# Patient Record
Sex: Female | Born: 1935 | Race: Black or African American | Hispanic: No
Health system: Southern US, Community
[De-identification: ages and names within clinical notes are randomized; demographics above are authoritative.]

## PROBLEM LIST (undated history)

## (undated) DIAGNOSIS — I639 Cerebral infarction, unspecified: Secondary | ICD-10-CM

## (undated) DIAGNOSIS — M1990 Unspecified osteoarthritis, unspecified site: Secondary | ICD-10-CM

## (undated) DIAGNOSIS — I10 Essential (primary) hypertension: Secondary | ICD-10-CM

---

## 2018-01-04 ENCOUNTER — Other Ambulatory Visit: Payer: Self-pay

## 2018-01-04 ENCOUNTER — Emergency Department
Admission: EM | Admit: 2018-01-04 | Discharge: 2018-01-04 | Disposition: A | Discharge status: Home / Self Care | Payer: Medicare Other | Attending: Student in an Organized Health Care Education/Training Program | Admitting: Student in an Organized Health Care Education/Training Program

## 2018-01-04 ENCOUNTER — Emergency Department: Payer: Medicare Other

## 2018-01-04 DIAGNOSIS — I10 Essential (primary) hypertension: Secondary | ICD-10-CM | POA: Insufficient documentation

## 2018-01-04 DIAGNOSIS — M79605 Pain in left leg: Secondary | ICD-10-CM | POA: Insufficient documentation

## 2018-01-04 DIAGNOSIS — W19XXXA Unspecified fall, initial encounter: Secondary | ICD-10-CM

## 2018-01-04 DIAGNOSIS — M79604 Pain in right leg: Secondary | ICD-10-CM | POA: Insufficient documentation

## 2018-01-04 DIAGNOSIS — F0390 Unspecified dementia without behavioral disturbance: Secondary | ICD-10-CM | POA: Insufficient documentation

## 2018-01-04 DIAGNOSIS — R531 Weakness: Secondary | ICD-10-CM | POA: Insufficient documentation

## 2018-01-04 HISTORY — DX: Essential (primary) hypertension: I10

## 2018-01-04 HISTORY — DX: Unspecified osteoarthritis, unspecified site: M19.90

## 2018-01-04 LAB — CBC WITH DIFFERENTIAL/PLATELET
BASOS PCT: 0 %
Basophils Absolute: 0 10*3/uL (ref 0–0.1)
Eosinophils Absolute: 0 10*3/uL (ref 0–0.7)
Eosinophils Relative: 0 %
HEMATOCRIT: 32.6 % — AB (ref 35.0–47.0)
Hemoglobin: 10.6 g/dL — ABNORMAL LOW (ref 12.0–16.0)
LYMPHS ABS: 0.7 10*3/uL — AB (ref 1.0–3.6)
Lymphocytes Relative: 14 %
MCH: 28.3 pg (ref 26.0–34.0)
MCHC: 32.5 g/dL (ref 32.0–36.0)
MCV: 86.9 fL (ref 80.0–100.0)
MONOS PCT: 10 %
Monocytes Absolute: 0.5 10*3/uL (ref 0.2–0.9)
NEUTROS ABS: 3.8 10*3/uL (ref 1.4–6.5)
Neutrophils Relative %: 76 %
Platelets: 174 10*3/uL (ref 150–440)
RBC: 3.75 MIL/uL — AB (ref 3.80–5.20)
RDW: 16.1 % — ABNORMAL HIGH (ref 11.5–14.5)
WBC: 5 10*3/uL (ref 3.6–11.0)

## 2018-01-04 LAB — COMPREHENSIVE METABOLIC PANEL
ALBUMIN: 3.3 g/dL — AB (ref 3.5–5.0)
ALK PHOS: 63 U/L (ref 38–126)
ALT: 26 U/L (ref 14–54)
ANION GAP: 14 (ref 5–15)
AST: 54 U/L — ABNORMAL HIGH (ref 15–41)
BILIRUBIN TOTAL: 0.7 mg/dL (ref 0.3–1.2)
BUN: 23 mg/dL — ABNORMAL HIGH (ref 6–20)
CO2: 24 mmol/L (ref 22–32)
CREATININE: 1.19 mg/dL — AB (ref 0.44–1.00)
Calcium: 9.1 mg/dL (ref 8.9–10.3)
Chloride: 95 mmol/L — ABNORMAL LOW (ref 101–111)
GFR calc Af Amer: 46 mL/min — ABNORMAL LOW (ref >60)
GFR calc non Af Amer: 40 mL/min — ABNORMAL LOW (ref >60)
GLUCOSE: 138 mg/dL — AB (ref 65–99)
Potassium: 3.8 mmol/L (ref 3.5–5.1)
SODIUM: 133 mmol/L — AB (ref 135–145)
TOTAL PROTEIN: 8 g/dL (ref 6.5–8.1)

## 2018-01-04 NOTE — ED Notes (Signed)
Report received - pt had xr - no urine collected and pt unable to urinate when checked.

## 2018-01-04 NOTE — ED Notes (Signed)
Went in room to cath pt - family at bedside. They do not want her cath'd just want discharged if nothing broken. They asked how she would get home - advised we could call ems. Family asked how much it would cost - unsure but if not covered they would get a bill. Family states they can take her. Grandson and daughter will take pt home.

## 2018-01-04 NOTE — ED Provider Notes (Signed)
[facility] Emergency Department Provider Note    First MD Initiated Contact with Patient 01/04/18 1755     (approximate)  I have reviewed the triage vital signs and the nursing notes.   HISTORY  Chief Complaint Fall  Level V Caveat:  dementia  HPI Ashlee Schwartz is a 84 y.o. female from [facility] facility reported fall today generalized weakness and flulike symptoms.  Patient is complaining initially of left leg pain but then subsequently changed and said that her right leg is hurting.  Denies any headache.  No abdominal pain.  No nausea or vomiting or fevers.  No cough or shortness of breath.  Past Medical History:  Diagnosis Date  . Arthritis   . Hypertension    History reviewed. No pertinent family history.  There are no active problems to display for this patient.     Prior to Admission medications   Not on File    Allergies Patient has no known allergies.    Social History Social History   Tobacco Use  . Smoking status: Not on file  . Smokeless tobacco: Never Used  Substance Use Topics  . Alcohol use: No    Frequency: Never  . Drug use: No    Review of Systems Patient denies headaches, rhinorrhea, blurry vision, numbness, shortness of breath, chest pain, edema, cough, abdominal pain, nausea, vomiting, diarrhea, dysuria, fevers, rashes or hallucinations unless otherwise stated above in HPI. ____________________________________________   PHYSICAL EXAM:  VITAL SIGNS: Vitals:   01/04/18 1755 01/04/18 1800  BP: (!) 162/69 (!) 148/71  Pulse: 71   Resp: 18 18  Temp: 99.3 F (37.4 C)   SpO2: 96%     Constitutional: Alert in no acute distress. Eyes: Conjunctivae are normal.  Head: Atraumatic. Nose: No congestion/rhinnorhea. Mouth/Throat: Mucous membranes are moist.   Neck: No stridor. Painless ROM.  Cardiovascular: Normal rate, regular rhythm. Grossly normal heart sounds.  Good peripheral  circulation. Respiratory: Normal respiratory effort.  No retractions. Lungs CTAB. Gastrointestinal: Soft and nontender. No distention. No abdominal bruits. No CVA tenderness. Genitourinary:  Musculoskeletal: No lower extremity tenderness nor edema.  No joint effusions. Neurologic:  Normal speech and language. No gross focal neurologic deficits are appreciated. No facial droop Skin:  Skin is warm, dry and intact. No rash noted. Psychiatric: Mood and affect are normal.  ____________________________________________   LABS (all labs ordered are listed, but only abnormal results are displayed)  Results for orders placed or performed during the hospital encounter of 01/04/18 (from the past 24 hour(s))  CBC with Differential/Platelet     Status: Abnormal   Collection Time: 01/04/18  6:05 PM  Result Value Ref Range   WBC 5.0 3.6 - 11.0 K/uL   RBC 3.75 (L) 3.80 - 5.20 MIL/uL   Hemoglobin 10.6 (L) 12.0 - 16.0 g/dL   HCT 16.132.6 (L) 09.635.0 - 04.547.0 %   MCV 86.9 80.0 - 100.0 fL   MCH 28.3 26.0 - 34.0 pg   MCHC 32.5 32.0 - 36.0 g/dL   RDW 40.916.1 (H) 81.111.5 - 91.414.5 %   Platelets 174 150 - 440 K/uL   Neutrophils Relative % 76 %   Neutro Abs 3.8 1.4 - 6.5 K/uL   Lymphocytes Relative 14 %   Lymphs Abs 0.7 (L) 1.0 - 3.6 K/uL   Monocytes Relative 10 %   Monocytes Absolute 0.5 0.2 - 0.9 K/uL   Eosinophils Relative 0 %   Eosinophils Absolute 0.0 0 - 0.7 K/uL  Basophils Relative 0 %   Basophils Absolute 0.0 0 - 0.1 K/uL  Comprehensive metabolic panel     Status: Abnormal   Collection Time: 01/04/18  6:05 PM  Result Value Ref Range   Sodium 133 (L) 135 - 145 mmol/L   Potassium 3.8 3.5 - 5.1 mmol/L   Chloride 95 (L) 101 - 111 mmol/L   CO2 24 22 - 32 mmol/L   Glucose, Bld 138 (H) 65 - 99 mg/dL   BUN 23 (H) 6 - 20 mg/dL   Creatinine, Ser 1.61 (H) 0.44 - 1.00 mg/dL   Calcium 9.1 8.9 - 09.6 mg/dL   Total Protein 8.0 6.5 - 8.1 g/dL   Albumin 3.3 (L) 3.5 - 5.0 g/dL   AST 54 (H) 15 - 41 U/L   ALT 26 14 - 54  U/L   Alkaline Phosphatase 63 38 - 126 U/L   Total Bilirubin 0.7 0.3 - 1.2 mg/dL   GFR calc non Af Amer 40 (L) >60 mL/min   GFR calc Af Amer 46 (L) >60 mL/min   Anion gap 14 5 - 15   ____________________________________________  ____________________________________________  RADIOLOGY  I personally reviewed all radiographic images ordered to evaluate for the above acute complaints and reviewed radiology reports and findings.  These findings were personally discussed with the patient.  Please see medical record for radiology report.  ____________________________________________   PROCEDURES  Procedure(s) performed:  Procedures    Critical Care performed: no ____________________________________________   INITIAL IMPRESSION / ASSESSMENT AND PLAN / ED COURSE  Pertinent labs & imaging results that were available during my care of the patient were reviewed by me and considered in my medical decision making (see chart for details).  DDX: Fracture, dehydration, UTI, pneumonia, syncope  Ashlee Schwartz is a 84 y.o. who presents to the ED with fall at facility.  Family at bedside states that this is a frequent occurrence for her and says that she is otherwise at baseline.  Radiographs are noticed fracture but did show evidence of chronic arthritis for which she has a history of rheumatoid arthritis.  Renal function shows some mild chronic kidney disease no evidence of infection.  Discussed options for further diagnostic testing with daughter and grandson at bedside and they have elected to take patient back to facility.  Have discussed with the patient and available family all diagnostics and treatments performed thus far and all questions were answered to the best of my ability. The patient demonstrates understanding and agreement with plan.       As part of my medical decision making, I reviewed the following data within the electronic MEDICAL RECORD NUMBER Nursing notes reviewed and  incorporated, Labs reviewed, notes from prior ED visits.   ____________________________________________   FINAL CLINICAL IMPRESSION(S) / ED DIAGNOSES  Final diagnoses:  Fall, initial encounter      NEW MEDICATIONS STARTED DURING THIS VISIT:  New Prescriptions   No medications on file     Note:  This document was prepared using Dragon voice recognition software and may include unintentional dictation errors.    Willy Eddy, MD 01/04/18 2033

## 2018-01-04 NOTE — Discharge Instructions (Signed)
Is return for any additional questions or concerns.

## 2018-01-04 NOTE — ED Notes (Signed)
To x-ray and returned, grandson sitting at bedside

## 2018-01-07 ENCOUNTER — Emergency Department: Payer: Medicare Other

## 2018-01-07 ENCOUNTER — Emergency Department
Admission: EM | Admit: 2018-01-07 | Discharge: 2018-01-07 | Disposition: A | Discharge status: Home / Self Care | Payer: Medicare Other | Attending: Emergency Medicine | Admitting: Emergency Medicine

## 2018-01-07 ENCOUNTER — Other Ambulatory Visit: Payer: Self-pay

## 2018-01-07 DIAGNOSIS — Z8673 Personal history of transient ischemic attack (TIA), and cerebral infarction without residual deficits: Secondary | ICD-10-CM | POA: Diagnosis not present

## 2018-01-07 DIAGNOSIS — G4751 Confusional arousals: Secondary | ICD-10-CM | POA: Diagnosis not present

## 2018-01-07 DIAGNOSIS — R410 Disorientation, unspecified: Secondary | ICD-10-CM

## 2018-01-07 DIAGNOSIS — I10 Essential (primary) hypertension: Secondary | ICD-10-CM | POA: Diagnosis not present

## 2018-01-07 DIAGNOSIS — Z79899 Other long term (current) drug therapy: Secondary | ICD-10-CM | POA: Insufficient documentation

## 2018-01-07 DIAGNOSIS — R4182 Altered mental status, unspecified: Secondary | ICD-10-CM | POA: Diagnosis present

## 2018-01-07 DIAGNOSIS — Z7982 Long term (current) use of aspirin: Secondary | ICD-10-CM | POA: Insufficient documentation

## 2018-01-07 HISTORY — DX: Cerebral infarction, unspecified: I63.9

## 2018-01-07 LAB — URINALYSIS, COMPLETE (UACMP) WITH MICROSCOPIC
Bilirubin Urine: NEGATIVE
GLUCOSE, UA: NEGATIVE mg/dL
Hgb urine dipstick: NEGATIVE
Ketones, ur: NEGATIVE mg/dL
LEUKOCYTES UA: NEGATIVE
NITRITE: NEGATIVE
PH: 5 (ref 5.0–8.0)
Protein, ur: NEGATIVE mg/dL
SPECIFIC GRAVITY, URINE: 1.01 (ref 1.005–1.030)

## 2018-01-07 LAB — CBC WITH DIFFERENTIAL/PLATELET
BASOS ABS: 0 10*3/uL (ref 0–0.1)
BASOS PCT: 1 %
EOS ABS: 0 10*3/uL (ref 0–0.7)
EOS PCT: 1 %
HCT: 31.5 % — ABNORMAL LOW (ref 35.0–47.0)
Hemoglobin: 10.5 g/dL — ABNORMAL LOW (ref 12.0–16.0)
Lymphocytes Relative: 19 %
Lymphs Abs: 0.9 10*3/uL — ABNORMAL LOW (ref 1.0–3.6)
MCH: 28.4 pg (ref 26.0–34.0)
MCHC: 33.2 g/dL (ref 32.0–36.0)
MCV: 85.7 fL (ref 80.0–100.0)
MONO ABS: 0.4 10*3/uL (ref 0.2–0.9)
Monocytes Relative: 9 %
Neutro Abs: 3.4 10*3/uL (ref 1.4–6.5)
Neutrophils Relative %: 70 %
PLATELETS: 202 10*3/uL (ref 150–440)
RBC: 3.68 MIL/uL — ABNORMAL LOW (ref 3.80–5.20)
RDW: 15.5 % — AB (ref 11.5–14.5)
WBC: 4.9 10*3/uL (ref 3.6–11.0)

## 2018-01-07 LAB — COMPREHENSIVE METABOLIC PANEL
ALBUMIN: 2.7 g/dL — AB (ref 3.5–5.0)
ALK PHOS: 57 U/L (ref 38–126)
ALT: 24 U/L (ref 14–54)
AST: 40 U/L (ref 15–41)
Anion gap: 11 (ref 5–15)
BILIRUBIN TOTAL: 0.6 mg/dL (ref 0.3–1.2)
BUN: 27 mg/dL — ABNORMAL HIGH (ref 6–20)
CALCIUM: 8.6 mg/dL — AB (ref 8.9–10.3)
CO2: 27 mmol/L (ref 22–32)
CREATININE: 1.21 mg/dL — AB (ref 0.44–1.00)
Chloride: 95 mmol/L — ABNORMAL LOW (ref 101–111)
GFR calc Af Amer: 45 mL/min — ABNORMAL LOW (ref >60)
GFR calc non Af Amer: 39 mL/min — ABNORMAL LOW (ref >60)
Glucose, Bld: 122 mg/dL — ABNORMAL HIGH (ref 65–99)
Potassium: 3.3 mmol/L — ABNORMAL LOW (ref 3.5–5.1)
Sodium: 133 mmol/L — ABNORMAL LOW (ref 135–145)
TOTAL PROTEIN: 7.4 g/dL (ref 6.5–8.1)

## 2018-01-07 NOTE — ED Provider Notes (Signed)
This patient was signed out to me by Dr. Michell HeinrichVernice.  84 year old female who was brought in for altered mental status, but upon arrival here was hemodynamically stable without any evidence of confusion.  In addition her physical examination was reassuring.  Her laboratory studies are reassuring.  She has a chronic hyponatremia with a sodium of 133 which is unchanged.  Her renal insufficiency is also at baseline.  She does not have any change in her chronic anemia.  Skin does not show any acute intracranial process and her chest x-ray is also reassuring without any evidence of infection.  Her urinalysis has bacteriuria without any other evidence of infection.  I have sent a culture but acute initiation of antibiotics is not indicated.  At this time, the patient is safe for discharge home.  I have discussed return precautions as well as follow-up instructions with her.   Rockne MenghiniNorman, Anne-Caroline, MD 01/07/18 2132

## 2018-01-07 NOTE — ED Notes (Signed)
Family member at bedside.

## 2018-01-07 NOTE — ED Notes (Signed)
Attempted IV x 2, unsuccessful. Asked Trish Magenna Rn to try.

## 2018-01-07 NOTE — ED Notes (Signed)
Pt taken to scans via stretcher. Will attempt IV access and blood draw when pt returns.

## 2018-01-07 NOTE — ED Provider Notes (Signed)
[facility] Emergency Department Provider Note  ____________________________________________  Time seen: Approximately 6:17 PM  I have reviewed the triage vital signs and the nursing notes.   HISTORY  Chief Complaint Altered Mental Status  Level 5 caveat:  Portions of the history and physical were unable to be obtained due to dementia   HPI Kaylinn Dedic is a 84 y.o. female with h/o CVA, HTN, RA, dementia who presents from [place] [facility] for altered mental status. According to the facility patient is usually alert, oriented, ambulatory. Today patient had difficulty walking, was very confused, was putting her underwear top of her clothes. When understand and staff told her to take her medication. No fever, no vomiting or diarrhea. Patient was seen here 3 days ago after a fall with negative bilateral hip x-rays. Patient has no complaints and denies abdominal pain, headache, chest pain, shortness of breath. She does report a mild cough. She denies vomiting or diarrhea. She denies dysuria.  Past Medical History:  Diagnosis Date  . Arthritis   . Hypertension   . Stroke [facility])     Prior to Admission medications   Medication Sig Start Date End Date Taking? Authorizing Provider  acetaminophen (TYLENOL) 325 MG tablet Take 650 mg by mouth every 6 (six) hours as needed.   Yes [provider]  aspirin EC 81 MG tablet Take 81 mg by mouth daily.   Yes [provider]  atorvastatin (LIPITOR) 40 MG tablet Take 40 mg by mouth at bedtime.   Yes [provider]  Calcium Carb-Cholecalciferol (CALCIUM 600 + D) 600-200 MG-UNIT TABS Take 1 tablet by mouth daily.   Yes [provider]  docusate sodium (COLACE) 100 MG capsule Take 100 mg by mouth 2 (two) times daily.   Yes [provider]  ferrous sulfate 325 (65 FE) MG EC tablet Take 325 mg by mouth daily.   Yes [provider]  hydrochlorothiazide (HYDRODIURIL) 25 MG  tablet Take 25 mg by mouth daily.   Yes [provider]  leflunomide (ARAVA) 20 MG tablet Take 20 mg by mouth daily.   Yes [provider]  Multiple Vitamins-Minerals (MULTIVITAMIN) tablet Take 1 tablet by mouth daily.   Yes [provider]  sertraline (ZOLOFT) 25 MG tablet Take 25 mg by mouth daily.   Yes [provider]    Allergies Patient has no known allergies.  History reviewed. No pertinent family history.  Social History Social History   Tobacco Use  . Smoking status: Not on file  . Smokeless tobacco: Never Used  Substance Use Topics  . Alcohol use: No    Frequency: Never  . Drug use: No    Review of Systems  Constitutional: Negative for fever. + confusion ENT: Negative for sore throat. Neck: No neck pain  Cardiovascular: Negative for chest pain. Respiratory: Negative for shortness of breath. Gastrointestinal: Negative for abdominal pain, vomiting or diarrhea. Genitourinary: Negative for dysuria. Musculoskeletal: Negative for back pain. Skin: Negative for rash. Neurological: Negative for headaches, weakness or numbness. Psych: No SI or HI  ____________________________________________   PHYSICAL EXAM:  VITAL SIGNS: ED Triage Vitals [01/07/18 1757]  Enc Vitals Group     BP 99/66     Pulse Rate (!) 47     Resp 18     Temp 98.7 F (37.1 C)     Temp Source Oral     SpO2 96 %     Weight      Height 5'  8" (1.727 m)     Head Circumference      Peak Flow      Pain Score 0     Pain Loc      Pain Edu?      Excl. in GC?     Constitutional: Alert and oriented x 2, answers questions appropriately, no distress.  HEENT:      Head: Normocephalic and atraumatic.         Eyes: Conjunctivae are normal. Sclera is non-icteric.       Mouth/Throat: Mucous membranes are dry.       Neck: Supple with no signs of meningismus. Cardiovascular: irregularly irregular rhythm but normal rate. No murmurs, gallops, or rubs. 2+ symmetrical  distal pulses are present in all extremities. No JVD. Respiratory: Normal respiratory effort. Lungs are clear to auscultation bilaterally. No wheezes, crackles, or rhonchi.  Gastrointestinal: Soft, non tender, and non distended with positive bowel sounds. No rebound or guarding. Musculoskeletal: Nontender with normal range of motion in all extremities. No edema, cyanosis, or erythema of extremities. Neurologic: Normal speech and language. Face is symmetric. Moving all extremities. No gross focal neurologic deficits are appreciated. Skin: Skin is warm, dry and intact. No rash noted. Psychiatric: Mood and affect are normal. Speech and behavior are normal.  ____________________________________________   LABS (all labs ordered are listed, but only abnormal results are displayed)  Labs Reviewed  CBC WITH DIFFERENTIAL/PLATELET - Abnormal; Notable for the following components:      Result Value   RBC 3.68 (*)    Hemoglobin 10.5 (*)    HCT 31.5 (*)    RDW 15.5 (*)    Lymphs Abs 0.9 (*)    All other components within normal limits  COMPREHENSIVE METABOLIC PANEL  URINALYSIS, COMPLETE (UACMP) WITH MICROSCOPIC  CBG MONITORING, ED   ____________________________________________  EKG  ED ECG REPORT I, Nita Sicklearolina Shanyce Daris, the attending physician, personally viewed and interpreted this ECG.  Normal sinus rhythm with frequent PACs, rate of 91, normal intervals, normal axis, no ST elevations or depressions. no prior for comparison ____________________________________________  RADIOLOGY  I have personally reviewed the images performed during this visit and I agree with the Radiologist's read.   Interpretation by Radiologist:  Dg Chest 2 View  Result Date: 01/07/2018 CLINICAL DATA:  Increased altered mental status. EXAM: CHEST - 2 VIEW COMPARISON:  None. FINDINGS: The heart size and mediastinal contours are within normal limits. Mild aortic atherosclerosis at the arch without aneurysm. Lungs  are hyperinflated with flattening of the diaphragms. No acute pneumonic consolidation or CHF. No effusion or pneumothorax. Minimal bibasilar atelectasis is noted left-greater-than-right. Mild degenerative change along the dorsal spine and both shoulders. IMPRESSION: Hyperinflated lungs. No active pulmonary disease. Aortic atherosclerosis. Electronically Signed   By: Tollie Ethavid  Kwon M.D.   On: 01/07/2018 18:45   Ct Head Wo Contrast  Result Date: 01/07/2018 CLINICAL DATA:  Increased confusion history of recent fall EXAM: CT HEAD WITHOUT CONTRAST TECHNIQUE: Contiguous axial images were obtained from the base of the skull through the vertex without intravenous contrast. COMPARISON:  None. FINDINGS: Brain: No acute territorial infarction, hemorrhage or intracranial mass is visualized. Moderate small vessel ischemic changes of the white matter. Probable old lacunar infarcts in the bilateral basal ganglia. Moderate-to-marked atrophy. Prominent ventricles likely related to atrophy Vascular: No hyperdense vessels.  Carotid vascular calcification Skull: No fracture Sinuses/Orbits: Mucosal thickening in the ethmoid sinuses. No acute orbital abnormality. Other: Small right forehead soft tissue thickening IMPRESSION: 1. No CT evidence for  acute intracranial abnormality. 2. Atrophy and small vessel ischemic changes of the white matter Electronically Signed   By: Jasmine Pang M.D.   On: 01/07/2018 18:36      ____________________________________________   PROCEDURES  Procedure(s) performed: None Procedures Critical Care performed:  None ____________________________________________   INITIAL IMPRESSION / ASSESSMENT AND PLAN / ED COURSE  84 y.o. female with h/o CVA, HTN, RA, dementia who presents from [place] [facility] care for altered mental status. Patient is well-appearing, in no distress, has normal vital signs, she is grossly neurologically intact, she is alert and oriented 2, she has no complaints. Her EKG  shows sinus rhythm with frequent PACs, abdomen is soft. Patient had a recent fall 3 days ago. We'll send patient for CT head to rule out intracranial injury. She is on blood thinners. We'll check UA to rule out UTI. We'll check labs to rule out dehydration, anemia, electrolyte abnormalities.  Clinical Course as of Jan 08 2004  Wed Jan 07, 2018  2003 CT head and CXR negative for acute findings. Labs and UA pending. Patient is remains alert and oriented x 2. Plan to dc back to SNF if patient remains alert and labs/ UA are within normal limits. Care transferred to Dr. Sharma Covert.  [CV]    Clinical Course User Index [CV] Don Perking [place], MD     As part of my medical decision making, I reviewed the following data within the electronic MEDICAL RECORD NUMBER Nursing notes reviewed and incorporated, Labs reviewed , EKG interpreted , Radiograph reviewed , Notes from prior ED visits and [place] Controlled Substance Database    Pertinent labs & imaging results that were available during my care of the patient were reviewed by me and considered in my medical decision making (see chart for details).    ____________________________________________   FINAL CLINICAL IMPRESSION(S) / ED DIAGNOSES  Final diagnoses:  Confusion      NEW MEDICATIONS STARTED DURING THIS VISIT:  ED Discharge Orders    None       Note:  This document was prepared using Dragon voice recognition software and may include unintentional dictation errors.    Don Perking, [place], MD 01/07/18 2005

## 2018-01-07 NOTE — ED Triage Notes (Signed)
Pt [facility] (hx dementia) via [facility]. Per EMS pt has been more confused and is not at baseline. [facility] states pt is alert, oriented, ambulatory usually but today couldn't walk, more confused, put her underwear on top of clothes, and was unable to swallow medications d/t not comprehending what to do. VSS with EMS, CBG 135. Pt had a fall on 3/17 with negative hip xrays. Pt denies pain. Answering questions appropriately for this RN.

## 2018-01-09 LAB — URINE CULTURE: Culture: NO GROWTH

## 2018-11-09 ENCOUNTER — Other Ambulatory Visit: Payer: Self-pay

## 2018-11-09 ENCOUNTER — Emergency Department
Admission: EM | Admit: 2018-11-09 | Discharge: 2018-11-10 | Disposition: A | Discharge status: Home / Self Care | Attending: Emergency Medicine | Admitting: Emergency Medicine

## 2018-11-09 DIAGNOSIS — I10 Essential (primary) hypertension: Secondary | ICD-10-CM | POA: Diagnosis not present

## 2018-11-09 DIAGNOSIS — Z79899 Other long term (current) drug therapy: Secondary | ICD-10-CM | POA: Insufficient documentation

## 2018-11-09 DIAGNOSIS — R627 Adult failure to thrive: Secondary | ICD-10-CM | POA: Diagnosis not present

## 2018-11-09 DIAGNOSIS — Z8673 Personal history of transient ischemic attack (TIA), and cerebral infarction without residual deficits: Secondary | ICD-10-CM | POA: Diagnosis not present

## 2018-11-09 NOTE — ED Triage Notes (Signed)
Pt brought in from [place] by EMS for lack of appetite and failure to thrive. Pt is a hospice pt for the same. Kendal Hymen (hospice RN) called prior to patient arrival.

## 2018-11-09 NOTE — ED Notes (Addendum)
Assumed most answers on triage as pt would only answer that she was in pain a "little bit." Pt moaned in discomfort. Keeps eyes closed unless specifically asked to open them. Breathing shallow. EDP assessed sacral area.

## 2018-11-10 LAB — COMPREHENSIVE METABOLIC PANEL
ALBUMIN: 2.8 g/dL — AB (ref 3.5–5.0)
ALK PHOS: 55 U/L (ref 38–126)
ALT: 8 U/L (ref 0–44)
ANION GAP: 5 (ref 5–15)
AST: 19 U/L (ref 15–41)
BILIRUBIN TOTAL: 0.5 mg/dL (ref 0.3–1.2)
BUN: 15 mg/dL (ref 8–23)
CALCIUM: 7.5 mg/dL — AB (ref 8.9–10.3)
CO2: 23 mmol/L (ref 22–32)
Chloride: 112 mmol/L — ABNORMAL HIGH (ref 98–111)
Creatinine, Ser: 0.89 mg/dL (ref 0.44–1.00)
GFR calc Af Amer: >60 mL/min (ref >60)
GFR, EST NON AFRICAN AMERICAN: 58 mL/min — AB (ref >60)
GLUCOSE: 72 mg/dL (ref 70–99)
POTASSIUM: 3.6 mmol/L (ref 3.5–5.1)
Sodium: 140 mmol/L (ref 135–145)
Total Protein: 5.9 g/dL — ABNORMAL LOW (ref 6.5–8.1)

## 2018-11-10 LAB — CBC WITH DIFFERENTIAL/PLATELET
Abs Immature Granulocytes: 0.01 10*3/uL (ref 0.00–0.07)
Basophils Absolute: 0 10*3/uL (ref 0.0–0.1)
Basophils Relative: 1 %
EOS ABS: 0.2 10*3/uL (ref 0.0–0.5)
EOS PCT: 7 %
HEMATOCRIT: 37.1 % (ref 36.0–46.0)
Hemoglobin: 11.6 g/dL — ABNORMAL LOW (ref 12.0–15.0)
Immature Granulocytes: 0 %
LYMPHS PCT: 50 %
Lymphs Abs: 1.6 10*3/uL (ref 0.7–4.0)
MCH: 29.6 pg (ref 26.0–34.0)
MCHC: 31.3 g/dL (ref 30.0–36.0)
MCV: 94.6 fL (ref 80.0–100.0)
MONO ABS: 0.4 10*3/uL (ref 0.1–1.0)
MONOS PCT: 13 %
NRBC: 0 % (ref 0.0–0.2)
Neutro Abs: 1 10*3/uL — ABNORMAL LOW (ref 1.7–7.7)
Neutrophils Relative %: 29 %
Platelets: 137 10*3/uL — ABNORMAL LOW (ref 150–400)
RBC: 3.92 MIL/uL (ref 3.87–5.11)
RDW: 14.3 % (ref 11.5–15.5)
WBC: 3.3 10*3/uL — ABNORMAL LOW (ref 4.0–10.5)

## 2018-11-10 LAB — GLUCOSE, CAPILLARY: GLUCOSE-CAPILLARY: 95 mg/dL (ref 70–99)

## 2018-11-10 MED ORDER — DEXTROSE 50 % IV SOLN
25.0000 mL | Freq: Once | INTRAVENOUS | Status: AC
  Administered 2018-11-10: 25 mL via INTRAVENOUS

## 2018-11-10 MED ORDER — SODIUM CHLORIDE 0.9 % IV BOLUS
1000.0000 mL | Freq: Once | INTRAVENOUS | Status: AC
  Administered 2018-11-10: 1000 mL via INTRAVENOUS

## 2018-11-10 MED ORDER — DEXTROSE 50 % IV SOLN
INTRAVENOUS | Status: AC
  Administered 2018-11-10: 25 mL via INTRAVENOUS
  Filled 2018-11-10: qty 50

## 2018-11-10 NOTE — ED Notes (Signed)
Family at bedside. 

## 2018-11-10 NOTE — ED Notes (Signed)
Pt given warm blankets. Family at bedside updated.

## 2018-11-10 NOTE — ED Provider Notes (Signed)
[facility] Emergency Department Provider Note  ____________________________________________   First MD Initiated Contact with Patient 11/09/18 2336     (approximate)  I have reviewed the triage vital signs and the nursing notes.   HISTORY  Chief Complaint Failure To Thrive   HPI Ashlee Schwartz is a 85 y.o. female with a history of dementia, arthritis, hypertension and stroke was presented emergency department with failure to thrive.  She is brought in by EMS reports normal vital signs.  Patient also is on hospice.  Patient is nonverbal at this time.  Further history obtained from sister-in-law who reports that she would like blood work done as well as nonpainful interventions such as possible IV fluids for dehydration.  Reports the patient has stopped eating and drinking over the past 3 to 4 days as well as become more somnolent.  POA/sister-in-law at the bedside says that she would not like a cathed urine specimen done.  Spoke with another relative on the phone who says that family may not be able to make a decision in this at this time as to whether the patient would be appropriate for hospice house or not he may want the patient admitted to the hospital.   Past Medical History:  Diagnosis Date  . Arthritis   . Hypertension   . Stroke [facility](HCC)     There are no active problems to display for this patient.   History reviewed. No pertinent surgical history.  Prior to Admission medications   Medication Sig Start Date End Date Taking? Authorizing Provider  acetaminophen (TYLENOL) 325 MG tablet Take 650 mg by mouth every 6 (six) hours as needed.   Yes [provider]  docusate sodium (COLACE) 100 MG capsule Take 100 mg by mouth 2 (two) times daily.   Yes [provider]  HYDROcodone-acetaminophen (NORCO/VICODIN) 5-325 MG tablet Take 1 tablet by mouth 3 (three) times daily as needed for moderate pain.   Yes [provider]  loperamide  (IMODIUM) 2 MG capsule Take 2 mg by mouth as needed for diarrhea or loose stools.   Yes [provider]  LORazepam (ATIVAN) 0.5 MG tablet Take 0.5 mg by mouth every 8 (eight) hours as needed for anxiety.   Yes [provider]  Morphine Sulfate (MORPHINE CONCENTRATE) 10 mg / 0.5 ml concentrated solution Take 5 mg by mouth every 2 (two) hours as needed for severe pain.   Yes [provider]  promethazine (PHENERGAN) 25 MG tablet Take 25 mg by mouth every 6 (six) hours as needed for nausea or vomiting.   Yes [provider]  sertraline (ZOLOFT) 25 MG tablet Take 25 mg by mouth daily.   Yes [provider]  traZODone (DESYREL) 50 MG tablet Take 50 mg by mouth at bedtime.   Yes [provider]  aspirin EC 81 MG tablet Take 81 mg by mouth daily.    [provider]  atorvastatin (LIPITOR) 40 MG tablet Take 40 mg by mouth at bedtime.    [provider]  Calcium Carb-Cholecalciferol (CALCIUM 600 + D) 600-200 MG-UNIT TABS Take 1 tablet by mouth daily.    [provider]  ferrous sulfate 325 (65 FE) MG EC tablet Take 325 mg by mouth daily.    [provider]  hydrochlorothiazide (HYDRODIURIL) 25 MG tablet Take 25 mg by mouth daily.    [provider]  leflunomide (ARAVA) 20 MG tablet Take 20 mg by mouth daily.    [provider]  Multiple Vitamins-Minerals (MULTIVITAMIN) tablet Take 1 tablet by mouth daily.    [provider]    Allergies Patient has no known allergies.  History reviewed. No pertinent family history.  Social History Social History   Tobacco Use  . Smoking status: Unknown If Ever Smoked  . Smokeless tobacco: Never Used  Substance Use Topics  . Alcohol use: No    Frequency: Never  . Drug use: No    Review of Systems  Level 5 caveat secondary to patient unresponsive at this time.   ____________________________________________   PHYSICAL EXAM:  VITAL  SIGNS: ED Triage Vitals  Enc Vitals Group     BP 11/09/18 2348 138/70     Pulse Rate 11/09/18 2350 73     Resp --      Temp 11/09/18 2343 97.8 F (36.6 C)     Temp Source 11/09/18 2343 Oral     SpO2 11/09/18 2350 98 %     Weight 11/09/18 2352 104 lb 6.4 oz (47.4 kg)     Height 11/09/18 2352 5\' 5"  (1.651 m)     Head Circumference --      Peak Flow --      Pain Score --      Pain Loc --      Pain Edu? --      Excl. in GC? --     Constitutional: Patient resting without any distress.  Occasionally opens eyes but is nonverbal. Eyes: Conjunctivae are normal.  Head: Atraumatic. Nose: No congestion/rhinnorhea. Mouth/Throat: Mucous membranes are moist.  Neck: No stridor.   Cardiovascular: Normal rate, regular rhythm. Grossly normal heart sounds.   Respiratory: Normal respiratory effort.  No retractions. Lungs CTAB. Gastrointestinal: Soft and nontender. No distention.  Musculoskeletal: No lower extremity tenderness nor edema.  No joint effusions. Neurologic:  Normal speech and language. No gross focal neurologic deficits are appreciated. Skin:  Skin is warm, dry and intact. No rash noted. Psychiatric: Mood and affect are normal. Speech and behavior are normal.  ____________________________________________   LABS (all labs ordered are listed, but only abnormal results are displayed)  Labs Reviewed  CBC WITH DIFFERENTIAL/PLATELET - Abnormal; Notable for the following components:      Result Value   WBC 3.3 (*)    Hemoglobin 11.6 (*)    Platelets 137 (*)    Neutro Abs 1.0 (*)    All other components within normal limits  COMPREHENSIVE METABOLIC PANEL - Abnormal; Notable for the following components:   Chloride 112 (*)    Calcium 7.5 (*)    Total Protein 5.9 (*)    Albumin 2.8 (*)    GFR calc non Af Amer 58 (*)    All other components within normal limits    ____________________________________________  EKG   ____________________________________________  RADIOLOGY   ____________________________________________   PROCEDURES  Procedure(s) performed:   Procedures  Critical Care performed:   ____________________________________________   INITIAL IMPRESSION / ASSESSMENT AND PLAN / ED COURSE  Pertinent labs & imaging results that were available during my care of the patient were reviewed by me and considered in my medical decision making (see chart for details).  Differential diagnosis includes, but is not limited to, alcohol, illicit or prescription medications, or other toxic ingestion; intracranial pathology such as stroke or intracerebral hemorrhage; fever or infectious causes including sepsis; hypoxemia and/or hypercarbia; uremia; trauma; endocrine related disorders such as diabetes, hypoglycemia, and thyroid-related diseases; hypertensive encephalopathy; etc. As part of my medical decision making, I  reviewed the following data within the electronic MEDICAL RECORD NUMBER Notes from prior ED visits  ----------------------------------------- 4:39 AM on 11/10/2018 -----------------------------------------  Patient at this time continues to be somnolent.  POA still at bedside and would like to speak with both social work and hospice to explore further options.  I will consult the services.  Patient remained in the emergency department.  No change in condition after fluids.  However, reassuring lab work. ____________________________________________   FINAL CLINICAL IMPRESSION(S) / ED DIAGNOSES  Failure to thrive.  NEW MEDICATIONS STARTED DURING THIS VISIT:  New Prescriptions   No medications on file     Note:  This document was prepared using Dragon voice recognition software and may include unintentional dictation errors.     Myrna Blazer, MD 11/10/18 [phone]

## 2018-11-10 NOTE — ED Notes (Signed)
Pt clean and dry, turned to right side. Vitals updated. Linens clean and dry.

## 2018-11-10 NOTE — ED Notes (Signed)
Hospice nurse at bedside.

## 2018-11-10 NOTE — ED Notes (Signed)
Report to Kate, RN

## 2018-11-10 NOTE — ED Notes (Signed)
Attempted IV x1.

## 2018-11-10 NOTE — ED Notes (Signed)
Pt unable to sign for DC d/t dementia/hospice. Pt family is not at bedside to sign for DC. [facility].

## 2018-11-10 NOTE — ED Notes (Signed)
CBG, 64. EDP informed. Attempted to given patient apple sauce but pt refuses to eat. EDP informed

## 2018-11-10 NOTE — ED Notes (Signed)
Hospice nurse at bedside, fed patient a cup of sherbert.

## 2018-11-10 NOTE — ED Notes (Addendum)
Family member requesting to go home and just be called by social work and hospice house when they arrive. Pt resting quietly with eyes closed and even respirations. No distress noted. Confirmed daughters phone number in computer at contact person for pt.

## 2018-11-10 NOTE — Discharge Instructions (Addendum)
Please return to the emergency department for any symptoms concerning to you.

## 2018-11-10 NOTE — Progress Notes (Signed)
ED visit made. Patient is currently followed by [facility] at [facility] with a hospice diagnosis of abnormal weight loss. She is a DNR code, with out of facility DNR in place at the facility. Patient was sent to the [facility] ED last night for evaluation of poor oral intake for the past few days. She has received IV fluids, labs appear to be WNL.  Patient seen lying on the ED stretcher, opened her eyes to voice. During visit patient ate a magic cup, albeit very slowly, no coughing or pocketing noted. No family at bedside. Writer spoke over the telephone to patient's HCPOA, her daughter in law Clarene Essex, discussed changes seen by her hospice team over the last few days. Clarene Essex has also been updated by her hospice case Production designer, theatre/television/film. Education and support provided. Plan is for patient to return back to [facility] care with the continued support of hospice services. Hospital care team and hospice team updated. Patient appeared to be resting in no distress at end of visit. Dayna Barker RN, BSN, [facility] [place], hospital Liaison [phone]

## 2018-12-20 DEATH — deceased

## 2019-04-07 IMAGING — CR DG FEMUR 2+V*L*
4 series · 4 of 4 positions shown · non-contrast
Comparison: None.

CLINICAL DATA: Pain following fall

EXAM:
LEFT FEMUR 2 VIEWS

[femur ap (1 of 2)]
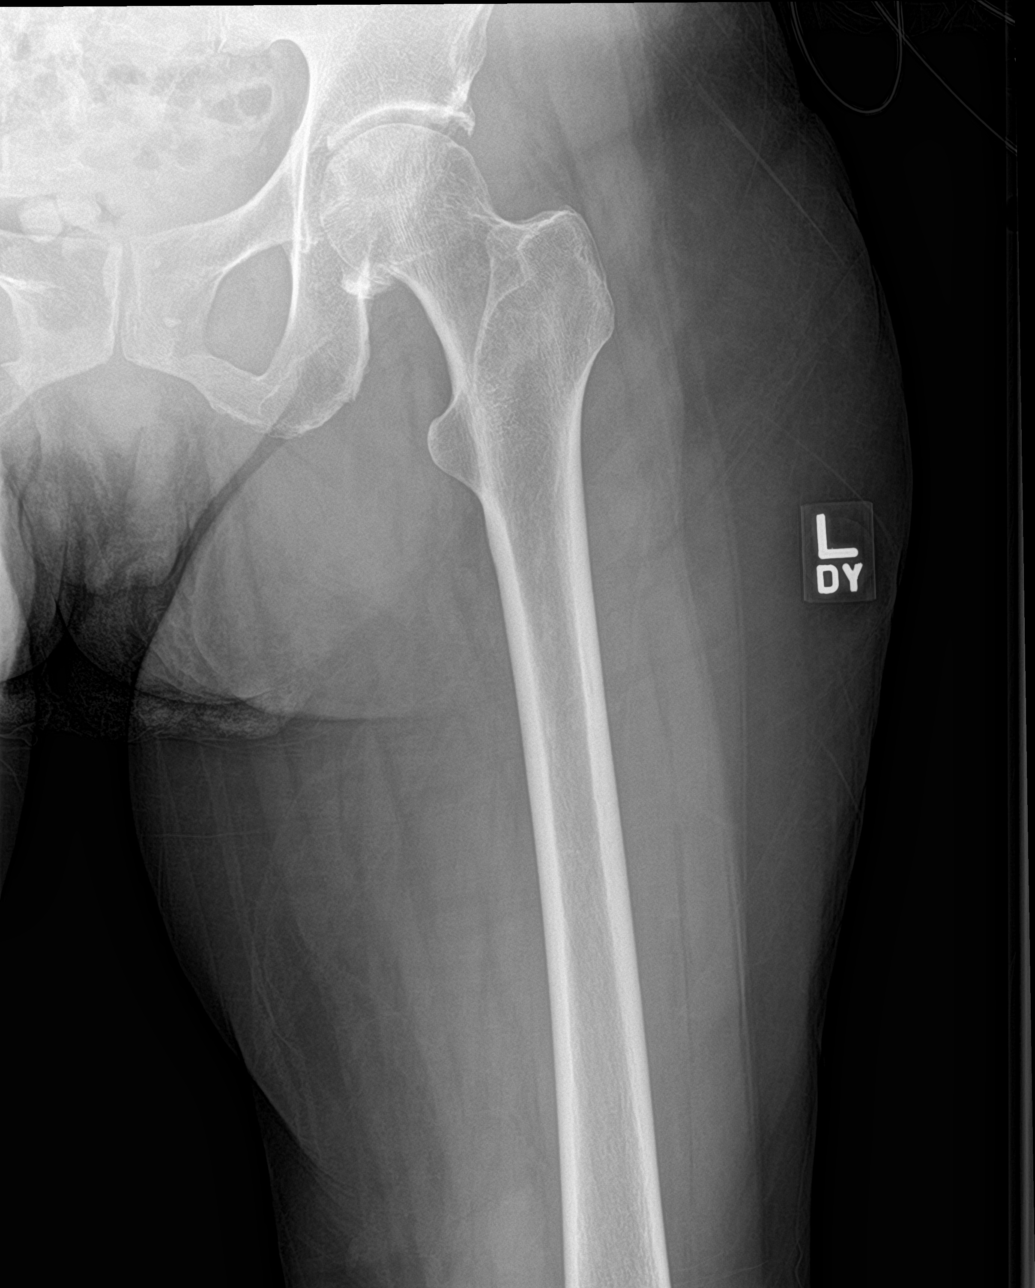

[femur ap (2 of 2)]
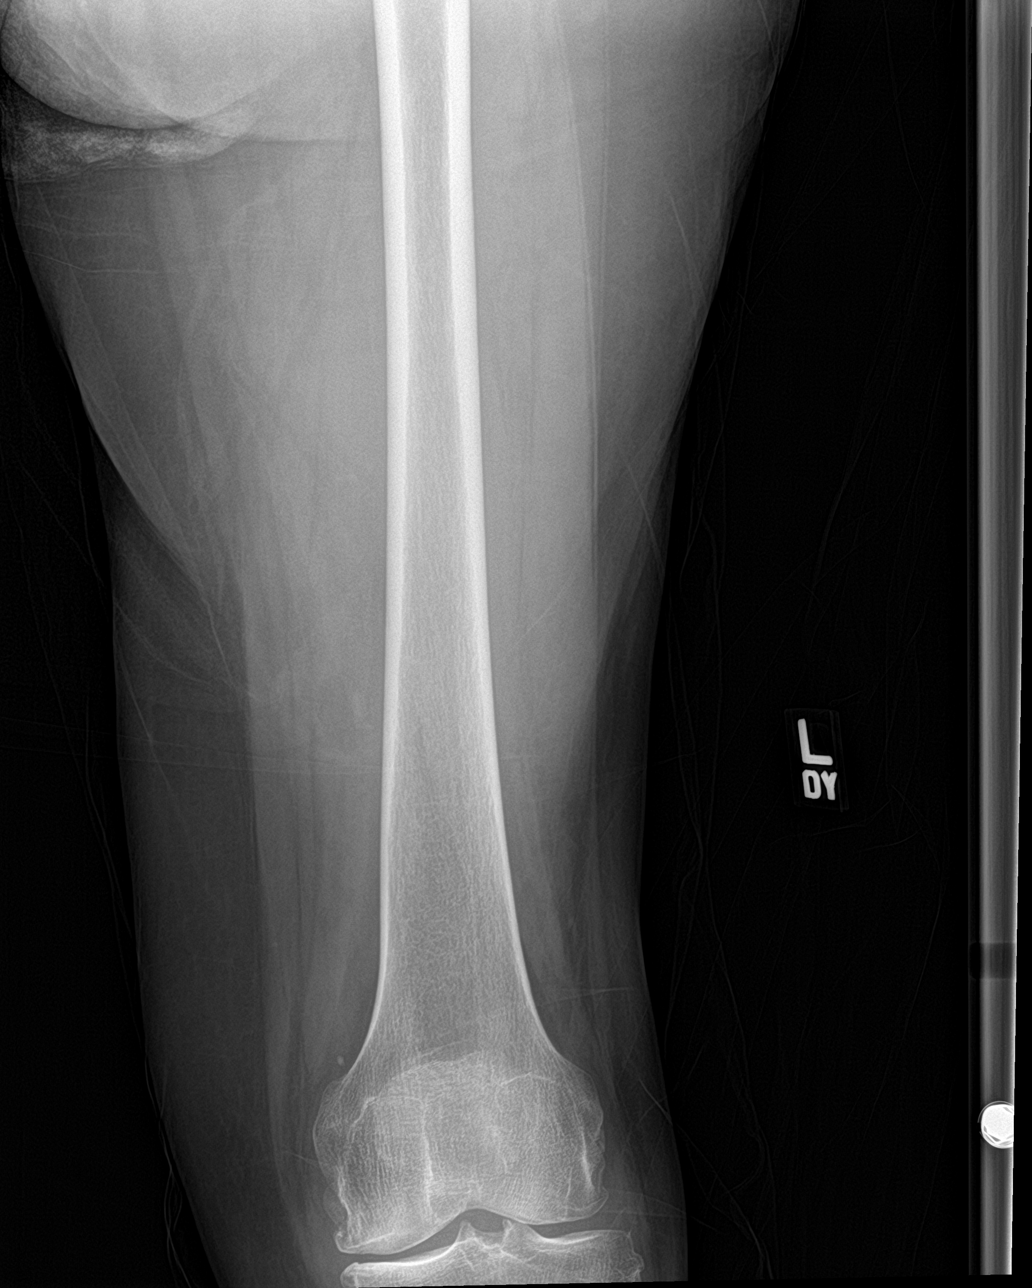

[femur lat (1 of 2)]
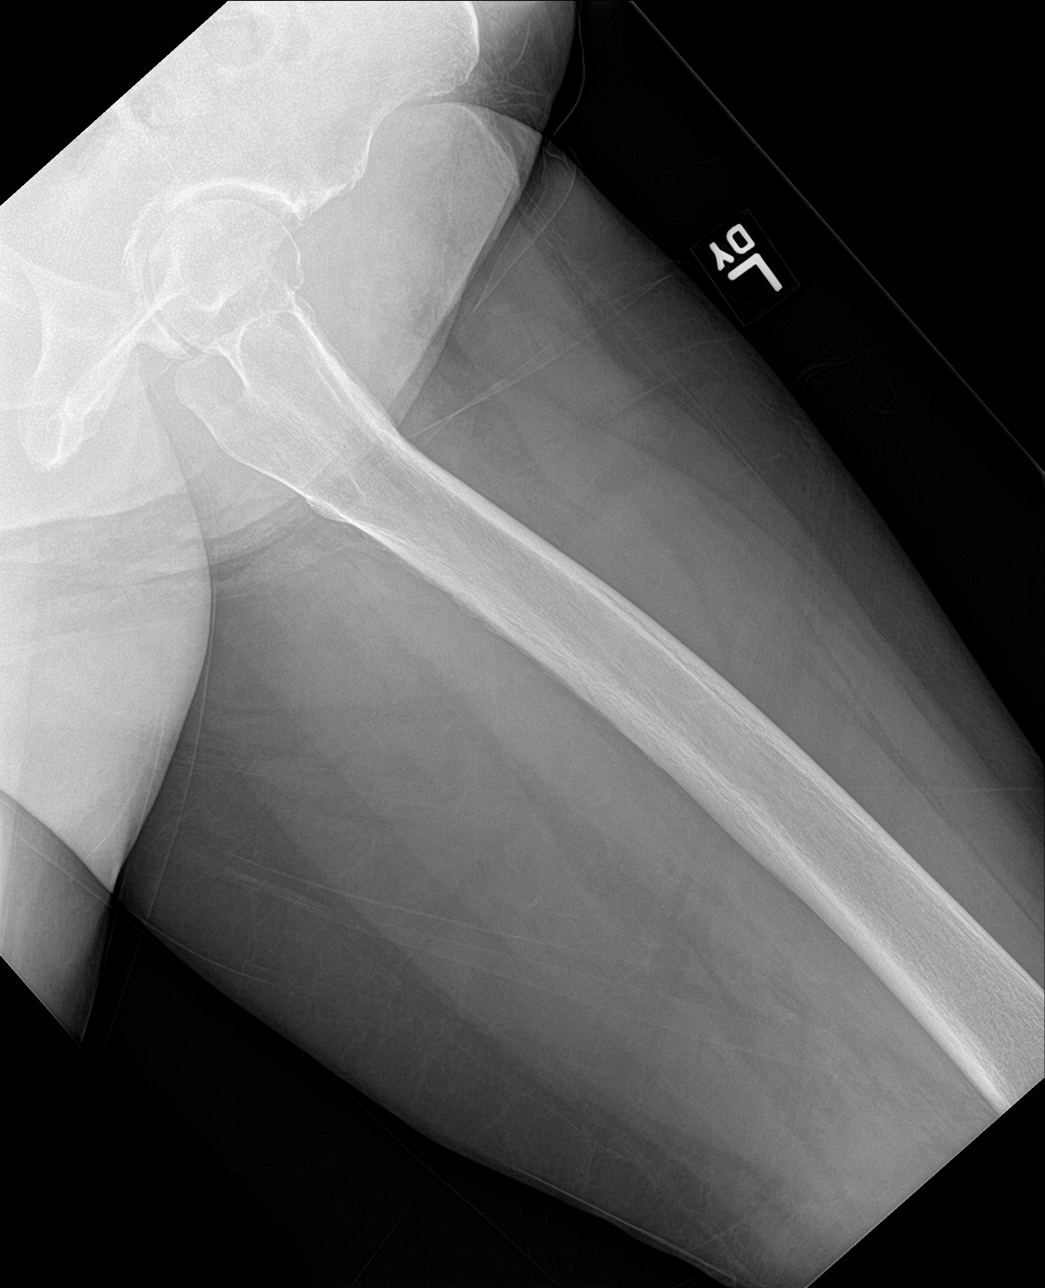

[femur lat (2 of 2)]
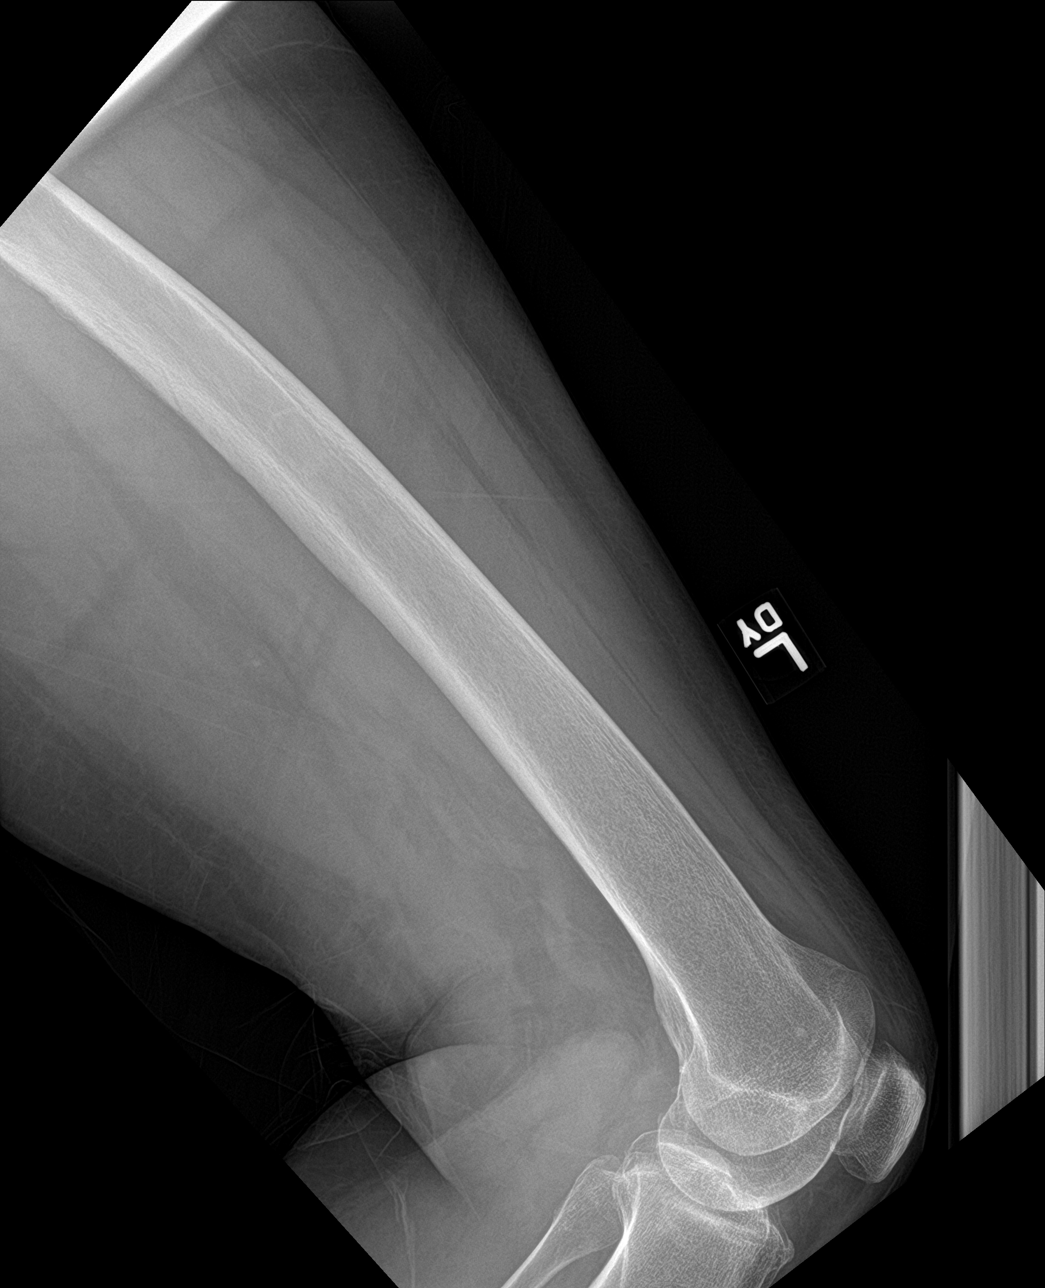

[4 of 4 positions shown; findings below may reference images not displayed]

FINDINGS: Frontal and lateral views were obtained. No fracture or dislocation.
There is mild narrowing of the hip and knee joints. No knee joint
effusion. No erosive change. No abnormal periosteal reaction.
IMPRESSION: Mild left hip and left knee osteoarthritis. No fracture or
dislocation.

## 2019-04-10 IMAGING — CR DG CHEST 2V
1 series · 2 of 2 positions shown · non-contrast
Comparison: None.

CLINICAL DATA: Increased altered mental status.

EXAM:
CHEST - 2 VIEW

[Series 1: dg chest 2 view · 0.14mm/px · 2 of 2 slices shown]
[im 1/2]
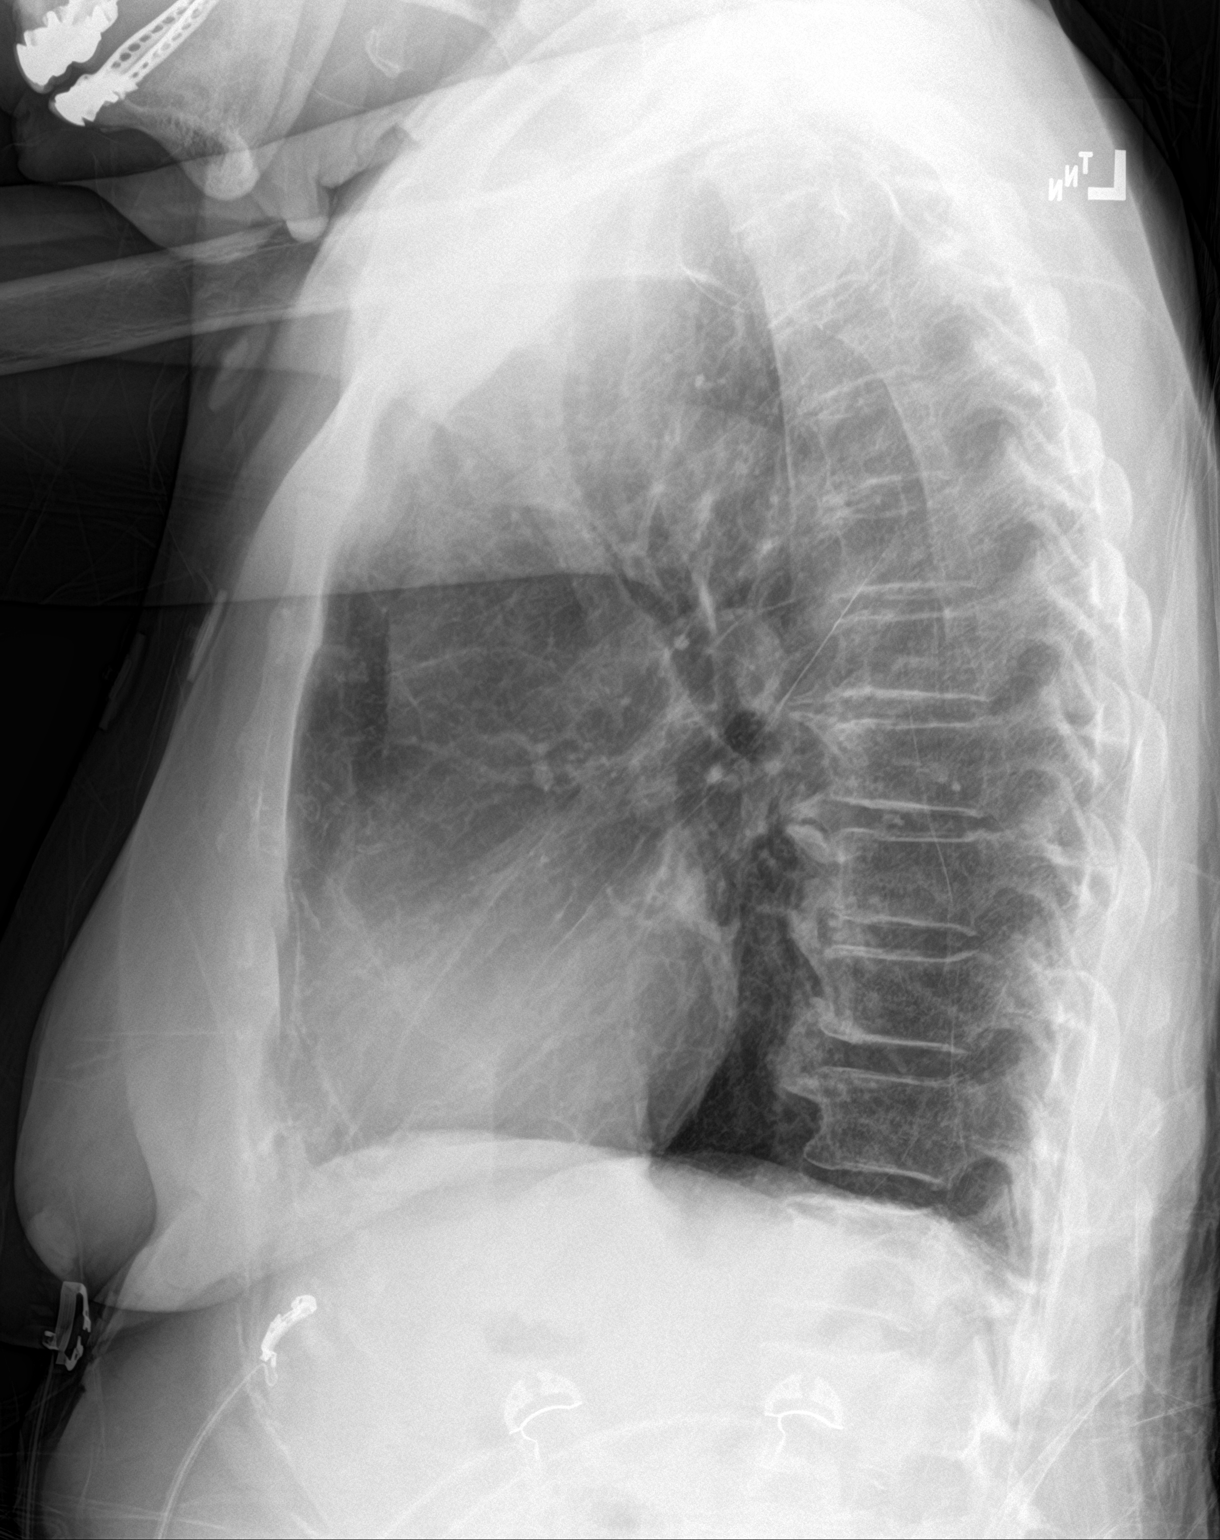
[im 2/2]
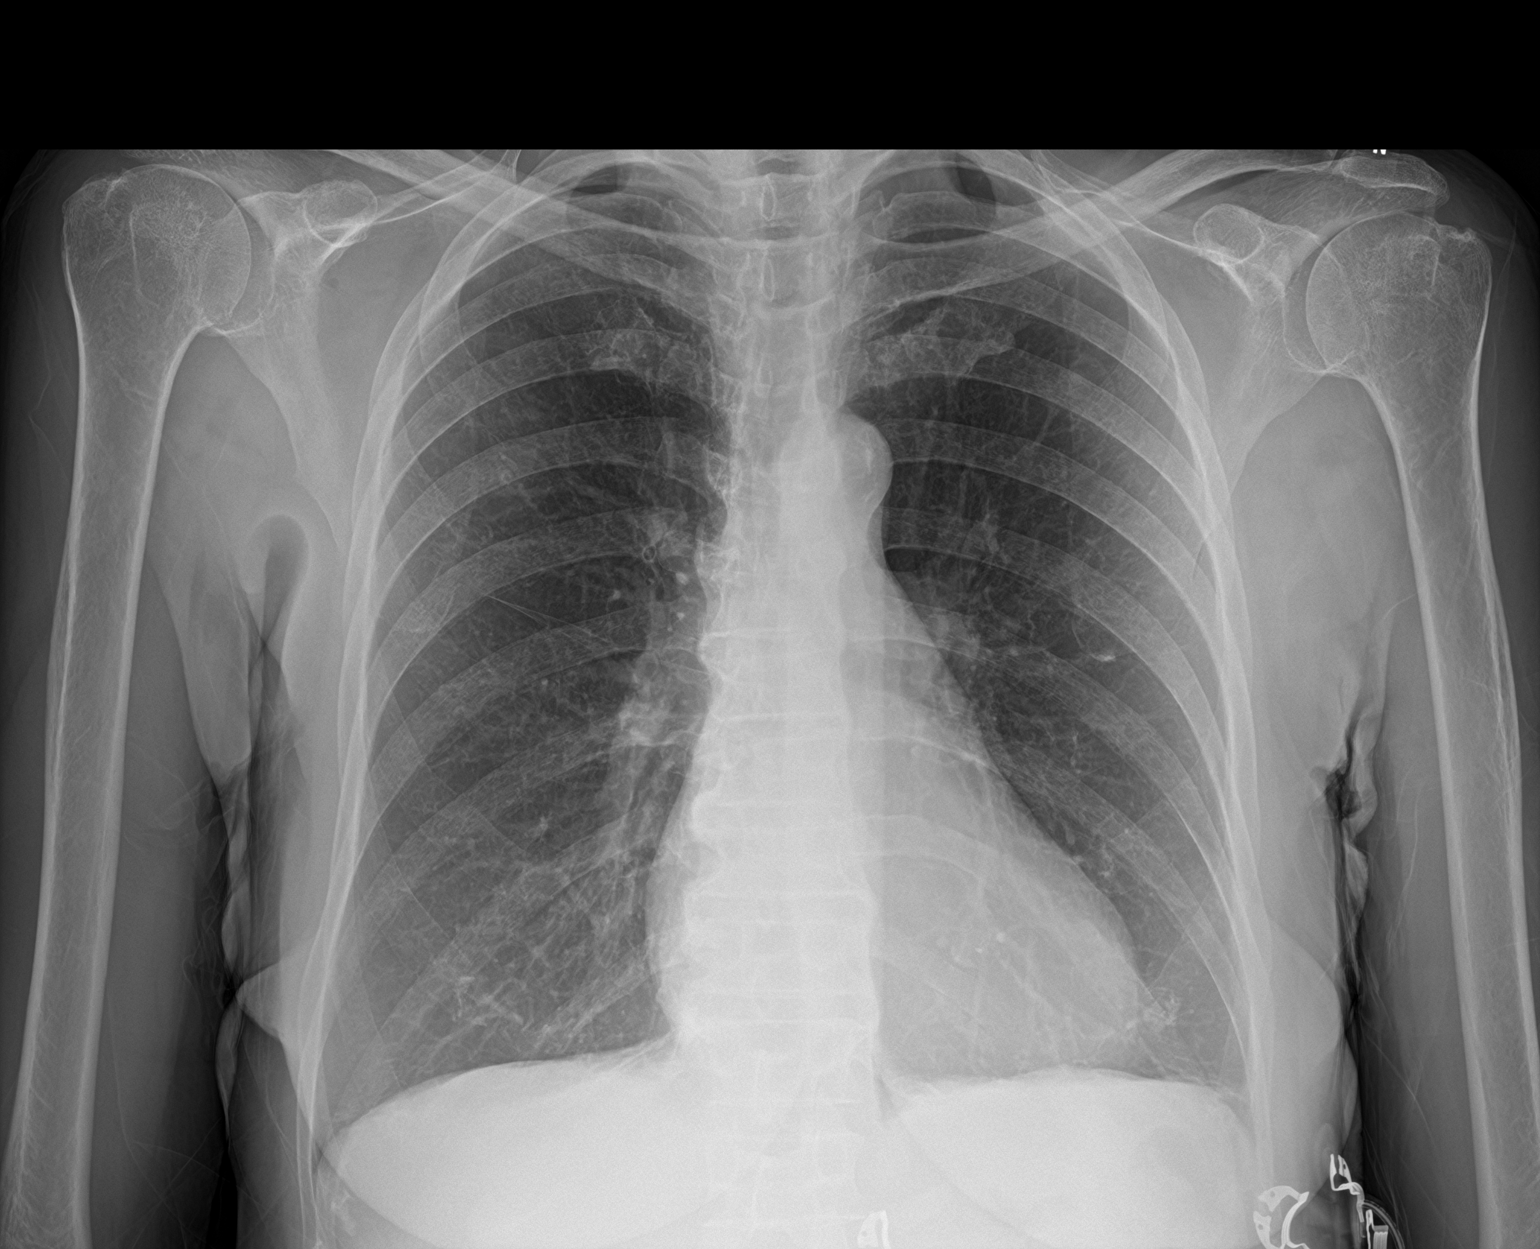

[2 of 2 positions shown; findings below may reference images not displayed]

FINDINGS: The heart size and mediastinal contours are within normal limits.
Mild aortic atherosclerosis at the arch without aneurysm. Lungs are
hyperinflated with flattening of the diaphragms. No acute pneumonic
consolidation or CHF. No effusion or pneumothorax. Minimal bibasilar
atelectasis is noted left-greater-than-right. Mild degenerative
change along the dorsal spine and both shoulders.
IMPRESSION: Hyperinflated lungs. No active pulmonary disease. Aortic
atherosclerosis.
